# Patient Record
Sex: Male | Born: 1980 | Race: White | Hispanic: Yes | Marital: Married | Smoking: Never smoker
Health system: Southern US, Community
[De-identification: ages and names within clinical notes are randomized; demographics above are authoritative.]

---

## 2010-05-09 ENCOUNTER — Emergency Department (HOSPITAL_BASED_OUTPATIENT_CLINIC_OR_DEPARTMENT_OTHER)
Admission: EM | Admit: 2010-05-09 | Discharge: 2010-05-09 | Payer: Self-pay | Source: Home / Self Care | Admitting: Emergency Medicine

## 2014-02-02 ENCOUNTER — Ambulatory Visit (INDEPENDENT_AMBULATORY_CARE_PROVIDER_SITE_OTHER): Payer: Self-pay

## 2014-02-02 ENCOUNTER — Ambulatory Visit (INDEPENDENT_AMBULATORY_CARE_PROVIDER_SITE_OTHER): Payer: Self-pay | Admitting: Family Medicine

## 2014-02-02 VITALS — BP 110/74 | HR 80 | Temp 97.9°F | Resp 18 | Ht 65.0 in | Wt 152.0 lb

## 2014-02-02 DIAGNOSIS — M25519 Pain in unspecified shoulder: Secondary | ICD-10-CM

## 2014-02-02 DIAGNOSIS — M25512 Pain in left shoulder: Secondary | ICD-10-CM

## 2014-02-02 DIAGNOSIS — S43109A Unspecified dislocation of unspecified acromioclavicular joint, initial encounter: Secondary | ICD-10-CM

## 2014-02-02 DIAGNOSIS — S43102A Unspecified dislocation of left acromioclavicular joint, initial encounter: Secondary | ICD-10-CM

## 2014-02-02 MED ORDER — TRAMADOL HCL 50 MG PO TABS: 50.0000 mg | ORAL_TABLET | Freq: Three times a day (TID) | ORAL | Status: DC | PRN

## 2014-02-02 MED ORDER — IBUPROFEN 800 MG PO TABS: 800.0000 mg | ORAL_TABLET | Freq: Three times a day (TID) | ORAL | Status: DC | PRN

## 2014-02-02 MED ORDER — CYCLOBENZAPRINE HCL 5 MG PO TABS: 5.0000 mg | ORAL_TABLET | Freq: Three times a day (TID) | ORAL | Status: DC | PRN

## 2014-02-02 NOTE — Progress Notes (Signed)
   Subjective:    Patient ID: Edgar Reed, male    DOB: March 13, 1981, 33 y.o.   MRN: [id number]  HPI    Review of Systems     Objective:   Physical Exam   [facility] reading (PRIMARY) by  Dr. Neva Seat. ? AC dislocation vs anterior shoulder dislocation       Assessment & Plan:
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
## 2014-02-02 NOTE — Patient Instructions (Addendum)
Use ibuprofen daily for light pain and tramadol for moderate pain. Use flexeril for muscle spasms. Keep sling on throughout the day even when sleeping. You will be called for your orthopedic appointment.

## 2014-02-04 NOTE — Progress Notes (Signed)
Xray read and patient discussed with Ms. Mitzi Davenport and Ms Valarie Cones. Agree with assessment and plan of care per their notes.  NVI distally. Suspected some displacement of clavicle in relation to acromion, indicating more advanced AC separation. Agree with sling and ortho eval.   XR report reviewed: IMPRESSION:  Widening of the left acromioclavicular joint without acute fracture.  Correlation with point tenderness is recommended.

## 2015-01-29 IMAGING — CR DG SHOULDER 2+V*L*
2 series · 2 of 2 positions shown · non-contrast
Comparison: None.

CLINICAL DATA: Recent traumatic injury with pain

EXAM:
LEFT SHOULDER - 2+ VIEW

[AP (1 of 2)]
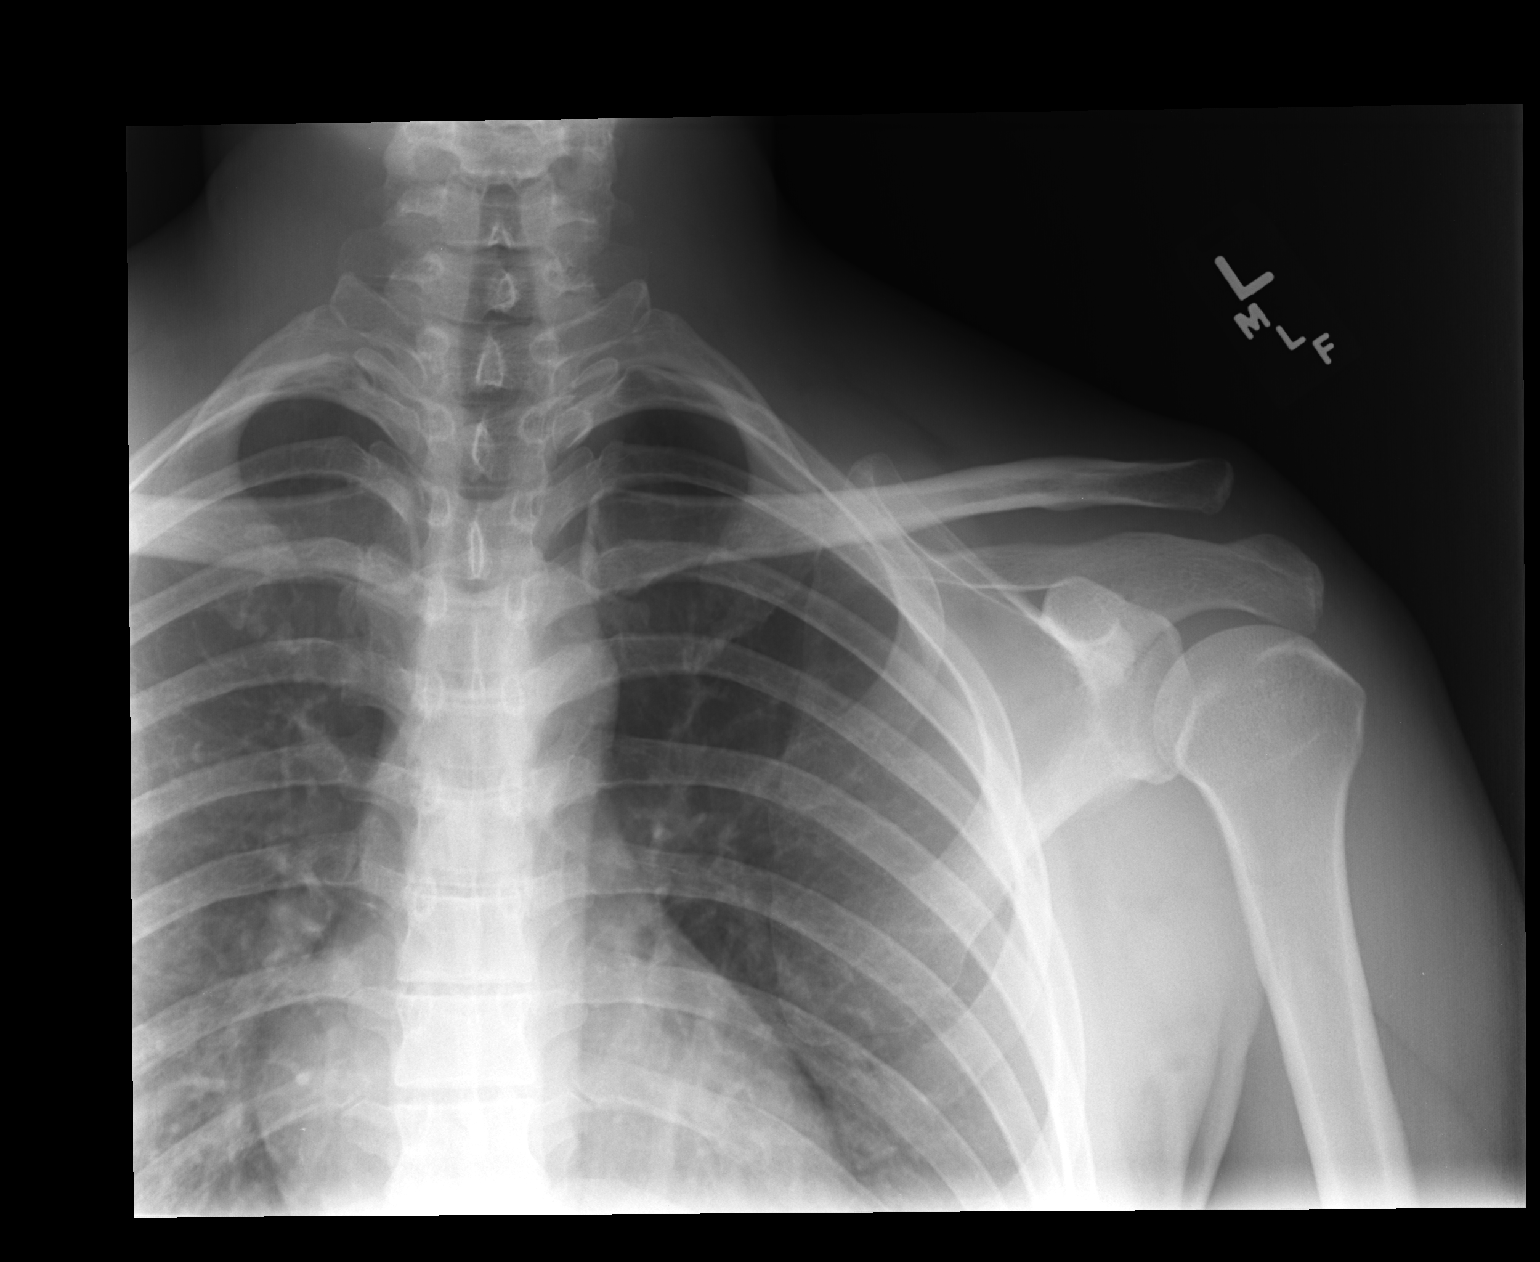

[AP (2 of 2)]
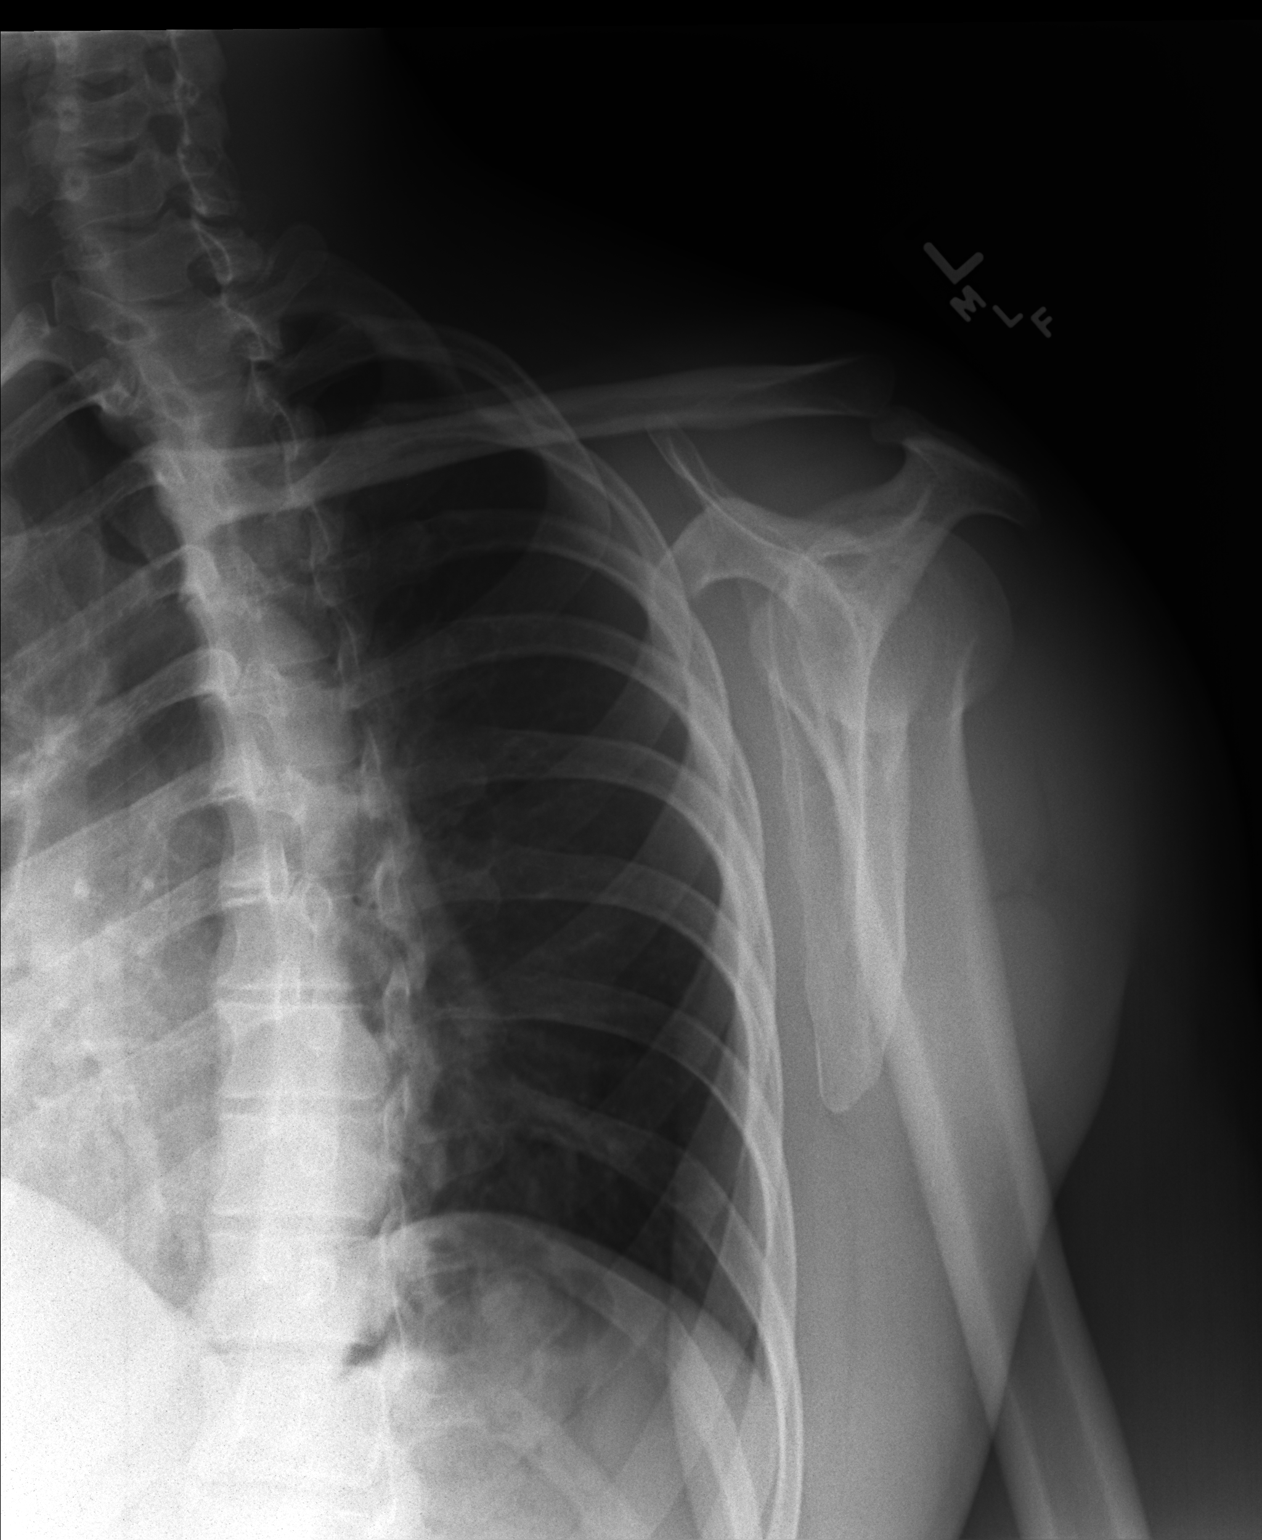

[2 of 2 positions shown; findings below may reference images not displayed]

FINDINGS: There is widening of the left acromioclavicular joint which may be
related to acute separation. Correlation with point tenderness is
recommended. No findings to suggest humeral dislocation are noted.
The underlying bony thorax is within normal limits.
IMPRESSION: Widening of the left acromioclavicular joint without acute fracture.
Correlation with point tenderness is recommended.

## 2015-05-31 ENCOUNTER — Encounter: Payer: Self-pay | Admitting: Internal Medicine

## 2015-05-31 ENCOUNTER — Ambulatory Visit (INDEPENDENT_AMBULATORY_CARE_PROVIDER_SITE_OTHER): Payer: Self-pay | Admitting: Internal Medicine

## 2015-05-31 VITALS — BP 110/70 | HR 74 | Ht 65.0 in | Wt 170.0 lb

## 2015-05-31 DIAGNOSIS — G8929 Other chronic pain: Secondary | ICD-10-CM

## 2015-05-31 DIAGNOSIS — K029 Dental caries, unspecified: Secondary | ICD-10-CM

## 2015-05-31 DIAGNOSIS — N529 Male erectile dysfunction, unspecified: Secondary | ICD-10-CM

## 2015-05-31 DIAGNOSIS — M545 Low back pain: Secondary | ICD-10-CM

## 2015-05-31 NOTE — Patient Instructions (Signed)
Can google "advance directives, [place]"  And bring up form from Secretary of State. Print and fill out Or can go to "5 wishes"  Which is also in Spanish and fill out--this costs $5--perhaps easier to use. Designate a Medical Power of Attorney to speak for you if you are unable to speak for yourself when ill or injured  

## 2015-05-31 NOTE — Progress Notes (Signed)
   Subjective:    Patient ID: Edgar Reed, male    DOB: 1980/08/21, 35 y.o.   MRN: [id number]  HPI   New patient  1.  Dental Pain:  Fairly diffuse.  Brushes teeth twice daily.  No flossing.  Have been bothering him on and off and now for 2 weeks.  Last time to dentist was 2 years ago.  Sounds like has had a lot of dental work.    2.  Bilateral Low Back Pain:  For 3 years.  No history of injury.  Fairly constant.  If takes Rheumofan Plus, supplement with glucosamine, magnesium, calcium, multivitamins, etc., the pain is better for 24 hours.  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 Does not seem to bother him so much at work.   Notes the pain the most when sitting on a hard chair for a bit.  No radiation of pain, no numbness, tingling or weakness of legs/feet. Has never had PT for this.  3.  ED:  Can only get a useful erection 1 time per month.  States this has been a problem for 4 years.  States his difficulties with this started gradually.  Pt. States he has the desire, but cannot even get a partial erection after he has had intercourse for a month.  Has two children, ages 49 yo and 90 yo.  No change in volume of testicles. No change in hair growth. No change in skin color.   No familial history of liver disorder. Pt. Is married. States he has a good relationship with his wife.   Is physically attracted to his wife.   Does not masturbate, so is unaware if he can get an erection with that.   Does have regular morning erections and has spontaneous ejaculation. Has not had a conversation with his wife about this. She gets mad at him when he is unable to have an erection.  Wife came in later to discuss with [place]. Not much information gleaned  Meds:  Glucosamine chondroitin combination  No Known Allergies    Review of Systems     Objective:   Physical Exam NAD HEENT:  Diffuse dental decay with obvious significant dental work in the past.  Throat without injection.  PERRL, EOMI, TMs pearly  gray Neck:  Supple, No adenopathy Chest:  CTA CV: RRR without murmur or rub, radial pulse normal and equal. Abd:  S, NT, No HSM or mass, +BS Back:  NT L/S spinous processes.  Mild bilateral paraspinous muscular tenderness.  Full ROM of back Genitalia:  Normal male, with normal testicular volume. No testicular mass or tenderness. No external lesions, Shaft of penis without lesion or tenderness. No  Discharge.        Assessment & Plan:  1.  Dental Decay: Dental Referral  2.  Back Pain:  Avoid hard chairs.  Stay physically active.  Recommended Physical therapy-referral made.  Encouraged application to [employer] and swimming, water aerobics.  3.  ED:  Appears to be secondary to fear of not being able to perform.  Urged them to consider couples counseling with Natosha Knight--warm hand off today.  Not clear the couple will return for this.

## 2015-06-13 ENCOUNTER — Ambulatory Visit (INDEPENDENT_AMBULATORY_CARE_PROVIDER_SITE_OTHER): Payer: Self-pay | Admitting: Licensed Clinical Social Worker

## 2015-06-13 DIAGNOSIS — Z630 Problems in relationship with spouse or partner: Secondary | ICD-10-CM

## 2015-06-14 NOTE — Progress Notes (Signed)
THERAPY PROGRESS NOTE  Session Time:  Participation Level: Active  Behavioral Response: Neat and Well GroomedAlertEuthymic  Type of Therapy: Individual Therapy  Treatment Goals addressed: Communication: within marriage  Interventions: Family Systems  Summary: Edgar Reed is a 35 y.o. male who presents with a positive mood and appropriate affect. His wife Edgar Reed attended the couples counseling session. Edgar Reed and Edgar Reed initially appeared very hesitant to talk about what brought them to counseling but they became more open and comfortable throughout the session. Lopez reported that their main problem is that when he goes to touch his wife or asks for sex, she rejects him. Edgar Reed reported that their main problem is that they argue all the time and she gets angry with him for not communicating. Both agreed that they wanted to stay together, but they both struggled to list the reasons that they want to be together. Their list included their children and that they love each other. Edgar Reed shared that she grew up without a father and so it is important to her that her children are raised by their father. Edgar Reed and Edgar Reed shared about the beginning of their relationship and what attracted them to each other. They reported that their problems started a year or two into their relationship, when Edgar Reed cheated on Edgar Reed. Edgar Reed shared her feelings of hurt and betrayal that ultimately lead to a loss of trust. Edgar Reed expressed that he made a mistake when he was young and that he has asked for forgiveness for years. The couple reported that Edgar Reed cheated on Edgar Reed 6 years ago, which Edgar Reed saw as revenge. Edgar Reed reported that it was not revenge, only a mistake. Edgar Reed and Edgar Reed processed about what kinds of love and affection they need from each other in order to forgive and move on. Edgar Reed shared that Edgar Reed never says nice things to him and that hearing those things would be very meaningful to him.  Edgar Reed confirmed that she has never given him compliments or said loving things to him, but she could not identify what the obstacle was. She committed to the "homework" assignment of saying one nice thing to Edgar Reed each day. Edgar Reed shared that her biggest complaint with her husband is that he does not pay attention to her when she is talking and that he reacts with anger when she talks about her extended family's problems. Edgar Reed and Edgar Reed processed about why her family's issues cause Edgar Reed to become upset and angry with Edgar Reed. Edgar Reed agreed to the "homework" of focusing on listening to Edgar Reed for support instead of trying to give advice about her family. The couple reported that they do not spend any time together alone without their children. They reported that they will argue "about almost anything." They shared that it would be difficult to go out together because their interests are so different and Edgar Reed does not feel comfortable going out without the children. They agreed to the "homework" of one short outing without the children to get coffee.  Suicidal/Homicidal: Nowithout intent/plan  Therapist Response: LCSW utilized supportive counseling techniques throughout the session in order to build rapport and encourage open expression of thoughts and feelings. LCSW inquired about the couples' reasons for seeking counseling. LCSW sought clarity about whether or not the couple was committed to staying together, and what their reasons are for wanting to be together. LCSW emphasized that both Edgar Reed and Edgar Reed will have to make changes in order to heal their hurt and move forward. LCSW asked the couple to identify  how they like to receive affection. LCSW assigned homework to both, based on the other person's need. LCSW encouraged the couple to spend some time together as a couple, as opposed to as a family, as it is important to care for their marriage relationship as well as their co-parenting  relationship.  Plan: Return again in 2 weeks.  Diagnosis: Axis I: See current Reed problem list    Axis II: No diagnosis    [name], LCSW 06/14/2015

## 2015-07-03 ENCOUNTER — Ambulatory Visit (INDEPENDENT_AMBULATORY_CARE_PROVIDER_SITE_OTHER): Payer: Self-pay | Admitting: Licensed Clinical Social Worker

## 2015-07-03 DIAGNOSIS — Z630 Problems in relationship with spouse or partner: Secondary | ICD-10-CM

## 2015-07-04 NOTE — Progress Notes (Signed)
   THERAPY PROGRESS NOTE  Session Time:  Participation Level: Active  Behavioral Response: Neat and Well GroomedAlertEuthymic  Type of Therapy: Individual Therapy  Treatment Goals addressed: Communication: within marriage/family  Interventions: Family Systems  Summary: Edgar Reed is a 35 y.o. male who presents with a euthymic mood and appropriate affect. He attended the counseling session with his wife Edgar Reed. Edgar Reed and Edgar Reed reported that they had completed some of their "homework" assignments but not all; they have still not gone on an outing without their children. Edgar Reed shared that he had been listening to his wife without reacting negatively, and wife affirmed that she felt better about his listening skills. Edgar Reed shared that she had said nice things to Edgar Reed only twice but committed to doing this more often. Edgar Reed and Edgar Reed shared about their 77-month separation last year, when Edgar Reed abruptly left the family to live with another man. Edgar Reed expressed the hurt and pain he felt when his wife left. Edgar Reed shared that she left because she did not feel that their relationship had any hope of getting better, and that Edgar Reed was always angry. The couple reported that after Edgar Reed's sudden return to the home about 6 months ago, they had not talked about the separation. Edgar Reed shared about how intense his sadness and pain is. Edgar Reed shared that she returned to the home because she missed her younger son and wanted her sons to have a family again. Edgar Reed shared that he still loves his wife and wants their relationship to improve. Edgar Reed shared that she feels there is some love between them but that she feels scared to fall in love with him again. She expressed her fears of Edgar Reed getting "big-headed" and then wanting to leave her for another woman. She agreed that her fear probably stemmed from her own infidelity. Edgar Reed and Edgar Reed agreed that their main goal for counseling was to recover  feelings of love and to be able to communicate about hard issues.  Suicidal/Homicidal: Nowithout intent/plan  Therapist Response: LCSW used supportive counseling techniques throughout the session. LCSW observed that Edgar Reed and Edgar Reed had a very difficult time talking openly about their feelings. LCSW encouraged them to set goals for counseling. LCSW provided affirmations to the couple for seeking help and for being willing to process their hurt and pain.  Plan: Return again in 2 weeks.  Diagnosis: Axis I: See current hospital problem list    Axis II: No diagnosis    [name], LCSW 07/04/2015

## 2015-07-17 ENCOUNTER — Ambulatory Visit (INDEPENDENT_AMBULATORY_CARE_PROVIDER_SITE_OTHER): Payer: Self-pay | Admitting: Licensed Clinical Social Worker

## 2015-07-17 DIAGNOSIS — Z630 Problems in relationship with spouse or partner: Secondary | ICD-10-CM

## 2015-07-18 NOTE — Progress Notes (Signed)
   THERAPY PROGRESS NOTE  Session Time: 60min  Participation Level: Active  Behavioral Response: CasualAlertEuthymic  Type of Therapy: Couples Therapy  Treatment Goals addressed: Communication: Marriage conflict  Interventions: Solution Focused and Strength-based  Summary: Leonia CoronaWilliam Ende is a 35 y.o. male who presents with a positive mood and appropriate affect. Eward's wife Noemi attended the couples session as well. Noemi and Chrissie NoaWilliam both reported that things have been slightly better over the past two weeks. They shared that they have been arguing less and slowly communicating more to each other. The couple processed about their separation, which they have not talked through before with each other. Chrissie NoaWilliam shared about his feelings of hurt and betrayal that his wife had left him. He reported that he cried every day of their separation. He asserted that he has forgiven her for the separation and takes responsibility for his part, in having focused too much on work. Noemi became tearful as she shared that the separation was "terrible." She expressed how much she missed her younger son and felt bad that her family was split up. She seemed unable to share much more about her experience. The couple reported that they had both forgiven each other although they still feel hurt. Chrissie NoaWilliam and Noemi discussed the concepts of trust and respect in their relationship, and what those concepts mean to them personally. Both reported that they feel they have a high level of trust and respect within their relationship, despite their problems. Noemi committed to using more endearments with Chrissie NoaWilliam over the next two weeks and Chrissie NoaWilliam committed to going on walks with Noemi.  Suicidal/Homicidal: Nowithout intent/plan  Therapist Response: LCSW utilized supportive counseling techniques throughout the session in order to validate emotions and encourage open expression of emotion. LCSW checked in regarding the couple's  recent level of conflict and arguments. LCSW encouraged the couple to process about their experiences while separated. LCSW introduced the important concepts of trust and respect and asked them to define the words for themselves. LCSW asked the couple to rate themselves on a scale of 1 to 10 for both trust and respect.  Plan: Return again in 2 weeks.  Diagnosis: Axis I: See current hospital problem list    Axis II: No diagnosis    Nilda Simmeratosha Vannak [place]en[place], LCSW 07/18/2015

## 2015-07-31 ENCOUNTER — Other Ambulatory Visit: Payer: No Typology Code available for payment source | Admitting: Licensed Clinical Social Worker

## 2015-08-14 ENCOUNTER — Other Ambulatory Visit: Payer: No Typology Code available for payment source | Admitting: Licensed Clinical Social Worker

## 2015-08-30 ENCOUNTER — Ambulatory Visit: Payer: No Typology Code available for payment source | Admitting: Internal Medicine

## 2015-08-31 ENCOUNTER — Telehealth: Payer: Self-pay | Admitting: Licensed Clinical Social Worker

## 2015-08-31 NOTE — Telephone Encounter (Signed)
LCSW attempted to contact pt regarding rescheduling counseling appointments. Was not able to leave a voicemail. Sent text to mobile. This was the third attempt at contact.
